# Patient Record
Sex: Male | Born: 1985 | Race: White | Hispanic: No | Marital: Single | Smoking: Current every day smoker
Health system: Southern US, Community
[De-identification: ages and names within clinical notes are randomized; demographics above are authoritative.]

## PROBLEM LIST (undated history)

## (undated) ENCOUNTER — Emergency Department (HOSPITAL_COMMUNITY): Payer: Self-pay

---
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
## 2017-07-22 ENCOUNTER — Emergency Department (HOSPITAL_COMMUNITY): Payer: Non-veteran care

## 2017-07-22 ENCOUNTER — Inpatient Hospital Stay (HOSPITAL_COMMUNITY): Payer: Non-veteran care

## 2017-07-22 ENCOUNTER — Encounter (HOSPITAL_COMMUNITY): Payer: Self-pay | Admitting: Radiology

## 2017-07-22 ENCOUNTER — Inpatient Hospital Stay (HOSPITAL_COMMUNITY)
Admission: EM | Admit: 2017-07-22 | Discharge: 2017-07-27 | DRG: 083 | Disposition: A | Discharge status: Home / Self Care | Payer: Non-veteran care | Attending: Surgery | Admitting: Surgery

## 2017-07-22 DIAGNOSIS — S0219XA Other fracture of base of skull, initial encounter for closed fracture: Secondary | ICD-10-CM | POA: Diagnosis present

## 2017-07-22 DIAGNOSIS — R40241 Glasgow coma scale score 13-15, unspecified time: Secondary | ICD-10-CM | POA: Diagnosis present

## 2017-07-22 DIAGNOSIS — G9389 Other specified disorders of brain: Secondary | ICD-10-CM | POA: Diagnosis present

## 2017-07-22 DIAGNOSIS — M25529 Pain in unspecified elbow: Secondary | ICD-10-CM

## 2017-07-22 DIAGNOSIS — S020XXB Fracture of vault of skull, initial encounter for open fracture: Secondary | ICD-10-CM | POA: Diagnosis present

## 2017-07-22 DIAGNOSIS — S064XAA Epidural hemorrhage with loss of consciousness status unknown, initial encounter: Secondary | ICD-10-CM | POA: Diagnosis present

## 2017-07-22 DIAGNOSIS — N179 Acute kidney failure, unspecified: Secondary | ICD-10-CM | POA: Diagnosis present

## 2017-07-22 DIAGNOSIS — H1130 Conjunctival hemorrhage, unspecified eye: Secondary | ICD-10-CM | POA: Diagnosis present

## 2017-07-22 DIAGNOSIS — T1490XA Injury, unspecified, initial encounter: Secondary | ICD-10-CM

## 2017-07-22 DIAGNOSIS — S064X9A Epidural hemorrhage with loss of consciousness of unspecified duration, initial encounter: Secondary | ICD-10-CM | POA: Diagnosis present

## 2017-07-22 DIAGNOSIS — S0101XA Laceration without foreign body of scalp, initial encounter: Secondary | ICD-10-CM | POA: Diagnosis present

## 2017-07-22 DIAGNOSIS — F4310 Post-traumatic stress disorder, unspecified: Secondary | ICD-10-CM | POA: Diagnosis present

## 2017-07-22 DIAGNOSIS — F17210 Nicotine dependence, cigarettes, uncomplicated: Secondary | ICD-10-CM | POA: Diagnosis present

## 2017-07-22 DIAGNOSIS — S065X9A Traumatic subdural hemorrhage with loss of consciousness of unspecified duration, initial encounter: Secondary | ICD-10-CM | POA: Diagnosis present

## 2017-07-22 DIAGNOSIS — S0181XA Laceration without foreign body of other part of head, initial encounter: Secondary | ICD-10-CM | POA: Diagnosis present

## 2017-07-22 DIAGNOSIS — R51 Headache: Secondary | ICD-10-CM | POA: Diagnosis present

## 2017-07-22 DIAGNOSIS — S066X9A Traumatic subarachnoid hemorrhage with loss of consciousness of unspecified duration, initial encounter: Secondary | ICD-10-CM | POA: Diagnosis present

## 2017-07-22 LAB — BPAM FFP
BLOOD PRODUCT EXPIRATION DATE: 201808052359
Blood Product Expiration Date: 201808042359
ISSUE DATE / TIME: 201807311242
ISSUE DATE / TIME: 201807311242
Unit Type and Rh: 600
Unit Type and Rh: 6200

## 2017-07-22 LAB — TYPE AND SCREEN
ABO/RH(D): O POS
ANTIBODY SCREEN: NEGATIVE
Unit division: 0
Unit division: 0

## 2017-07-22 LAB — PREPARE FRESH FROZEN PLASMA
UNIT DIVISION: 0
Unit division: 0

## 2017-07-22 LAB — COMPREHENSIVE METABOLIC PANEL
ALBUMIN: 4.1 g/dL (ref 3.5–5.0)
ALK PHOS: 61 U/L (ref 38–126)
ALT: 15 U/L — AB (ref 17–63)
ANION GAP: 9 (ref 5–15)
AST: 21 U/L (ref 15–41)
BUN: 14 mg/dL (ref 6–20)
CALCIUM: 9 mg/dL (ref 8.9–10.3)
CO2: 25 mmol/L (ref 22–32)
CREATININE: 1.46 mg/dL — AB (ref 0.61–1.24)
Chloride: 103 mmol/L (ref 101–111)
GFR calc Af Amer: >60 mL/min (ref >60)
GFR calc non Af Amer: >60 mL/min (ref >60)
GLUCOSE: 123 mg/dL — AB (ref 65–99)
Potassium: 3.8 mmol/L (ref 3.5–5.1)
SODIUM: 137 mmol/L (ref 135–145)
Total Bilirubin: 0.7 mg/dL (ref 0.3–1.2)
Total Protein: 7.4 g/dL (ref 6.5–8.1)

## 2017-07-22 LAB — BPAM RBC
BLOOD PRODUCT EXPIRATION DATE: 201808032359
Blood Product Expiration Date: 201808042359
ISSUE DATE / TIME: 201807311241
ISSUE DATE / TIME: 201807311241
UNIT TYPE AND RH: 9500
Unit Type and Rh: 9500

## 2017-07-22 LAB — CBC
HCT: 47 % (ref 39.0–52.0)
HEMOGLOBIN: 15.9 g/dL (ref 13.0–17.0)
MCH: 31.8 pg (ref 26.0–34.0)
MCHC: 33.8 g/dL (ref 30.0–36.0)
MCV: 94 fL (ref 78.0–100.0)
Platelets: 251 10*3/uL (ref 150–400)
RBC: 5 MIL/uL (ref 4.22–5.81)
RDW: 13.6 % (ref 11.5–15.5)
WBC: 15 10*3/uL — ABNORMAL HIGH (ref 4.0–10.5)

## 2017-07-22 LAB — RAPID URINE DRUG SCREEN, HOSP PERFORMED
Amphetamines: POSITIVE — AB
BARBITURATES: NOT DETECTED
BENZODIAZEPINES: POSITIVE — AB
COCAINE: NOT DETECTED
Opiates: NOT DETECTED
TETRAHYDROCANNABINOL: NOT DETECTED

## 2017-07-22 LAB — PROTIME-INR
INR: 0.98
Prothrombin Time: 13 seconds (ref 11.4–15.2)

## 2017-07-22 LAB — ETHANOL

## 2017-07-22 LAB — ABO/RH: ABO/RH(D): O POS

## 2017-07-22 MED ORDER — IOPAMIDOL (ISOVUE-370) INJECTION 76%
INTRAVENOUS | Status: AC
  Administered 2017-07-22: 50 mL
  Filled 2017-07-22: qty 50

## 2017-07-22 MED ORDER — LIDOCAINE-EPINEPHRINE (PF) 2 %-1:200000 IJ SOLN
20.0000 mL | Freq: Once | INTRAMUSCULAR | Status: AC
  Administered 2017-07-22: 20 mL via INTRADERMAL

## 2017-07-22 MED ORDER — SODIUM CHLORIDE 0.9 % IV SOLN
500.0000 mg | Freq: Two times a day (BID) | INTRAVENOUS | Status: DC
  Administered 2017-07-22 – 2017-07-27 (×10): 500 mg via INTRAVENOUS
  Filled 2017-07-22 (×12): qty 5

## 2017-07-22 MED ORDER — DEXTROSE 5 % IV SOLN
1.0000 g | Freq: Three times a day (TID) | INTRAVENOUS | Status: AC
  Administered 2017-07-22 – 2017-07-27 (×14): 1 g via INTRAVENOUS
  Filled 2017-07-22 (×14): qty 1

## 2017-07-22 MED ORDER — VANCOMYCIN HCL IN DEXTROSE 750-5 MG/150ML-% IV SOLN
750.0000 mg | Freq: Two times a day (BID) | INTRAVENOUS | Status: DC
  Administered 2017-07-23: 750 mg via INTRAVENOUS
  Filled 2017-07-22 (×2): qty 150

## 2017-07-22 MED ORDER — CEFAZOLIN SODIUM-DEXTROSE 2-4 GM/100ML-% IV SOLN
2.0000 g | Freq: Once | INTRAVENOUS | Status: AC
  Administered 2017-07-22: 2 g via INTRAVENOUS
  Filled 2017-07-22: qty 100

## 2017-07-22 MED ORDER — POTASSIUM CHLORIDE IN NACL 20-0.9 MEQ/L-% IV SOLN
INTRAVENOUS | Status: DC
  Administered 2017-07-22 – 2017-07-23 (×2): via INTRAVENOUS
  Filled 2017-07-22 (×4): qty 1000

## 2017-07-22 MED ORDER — ONDANSETRON 4 MG PO TBDP: 4.0000 mg | ORAL_TABLET | Freq: Four times a day (QID) | ORAL | Status: DC | PRN

## 2017-07-22 MED ORDER — FENTANYL CITRATE (PF) 100 MCG/2ML IJ SOLN
INTRAMUSCULAR | Status: AC
  Filled 2017-07-22: qty 2

## 2017-07-22 MED ORDER — PANTOPRAZOLE SODIUM 40 MG PO TBEC: 40.0000 mg | DELAYED_RELEASE_TABLET | Freq: Every day | ORAL | Status: DC

## 2017-07-22 MED ORDER — BACITRACIN ZINC 500 UNIT/GM EX OINT
TOPICAL_OINTMENT | Freq: Two times a day (BID) | CUTANEOUS | Status: DC
  Administered 2017-07-22 – 2017-07-23 (×2): via TOPICAL
  Administered 2017-07-23: 1 via TOPICAL
  Administered 2017-07-23 – 2017-07-24 (×2): via TOPICAL
  Administered 2017-07-24: 1 via TOPICAL
  Administered 2017-07-25 – 2017-07-27 (×5): via TOPICAL
  Filled 2017-07-22 (×17): qty 28.35

## 2017-07-22 MED ORDER — TETANUS-DIPHTH-ACELL PERTUSSIS 5-2.5-18.5 LF-MCG/0.5 IM SUSP
0.5000 mL | Freq: Once | INTRAMUSCULAR | Status: AC
  Administered 2017-07-22: 0.5 mL via INTRAMUSCULAR
  Filled 2017-07-22: qty 0.5

## 2017-07-22 MED ORDER — LIDOCAINE-EPINEPHRINE (PF) 2 %-1:200000 IJ SOLN
INTRAMUSCULAR | Status: AC
  Filled 2017-07-22: qty 20

## 2017-07-22 MED ORDER — ONDANSETRON HCL 4 MG/2ML IJ SOLN: 4.0000 mg | Freq: Four times a day (QID) | INTRAMUSCULAR | Status: DC | PRN

## 2017-07-22 MED ORDER — VANCOMYCIN HCL IN DEXTROSE 1-5 GM/200ML-% IV SOLN
1000.0000 mg | Freq: Once | INTRAVENOUS | Status: AC
  Administered 2017-07-22: 1000 mg via INTRAVENOUS
  Filled 2017-07-22: qty 200

## 2017-07-22 MED ORDER — FENTANYL CITRATE (PF) 100 MCG/2ML IJ SOLN
25.0000 ug | INTRAMUSCULAR | Status: DC | PRN
  Administered 2017-07-23: 50 ug via INTRAVENOUS
  Filled 2017-07-22: qty 2

## 2017-07-22 MED ORDER — PANTOPRAZOLE SODIUM 40 MG IV SOLR
40.0000 mg | Freq: Every day | INTRAVENOUS | Status: DC
  Administered 2017-07-22: 40 mg via INTRAVENOUS
  Filled 2017-07-22: qty 40

## 2017-07-22 MED ORDER — FENTANYL CITRATE (PF) 100 MCG/2ML IJ SOLN
100.0000 ug | Freq: Once | INTRAMUSCULAR | Status: AC
  Administered 2017-07-22: 100 ug via INTRAVENOUS

## 2017-07-22 MED ORDER — FENTANYL CITRATE (PF) 100 MCG/2ML IJ SOLN
INTRAMUSCULAR | Status: AC
  Administered 2017-07-22: 100 ug
  Filled 2017-07-22: qty 2

## 2017-07-22 NOTE — ED Notes (Signed)
Patient is starting to talk more c/o pain in left arm and head however can't give a pain number.
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
## 2017-07-22 NOTE — Progress Notes (Addendum)
Reviewed repeat head CT and discussed with Dr Bevely Palmeritty. Fairly stable. No need for surgical intervention. Continue frequent neuro checks. Repeat head CT at 0600 tomorrow, sooner as indicated.   Addendum Saw patient while at hospital Remains neurologically stable Knows at hospital, but unsure of year Follows all commands Moves all extremities well with good strength with exception of LUE weakness secondary to pain

## 2017-07-22 NOTE — ED Notes (Signed)
[place] [employer] [place] spoke with Dad Dorene Sorrow( Jerry ) [phone]  I also spoke with dad and he is on his way
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
## 2017-07-22 NOTE — ED Provider Notes (Signed)
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 results that were available during my care of the patient were reviewed by me and considered in my medical decision making (see chart for details).     CT scan shows epidural hematoma and pneumocephalus. Neurosurgery was consulted. Patient has remained awake and alert. He was given tetanus and 2 g IV Ancef. Fentanyl IV for pain. Neurosurgery evaluated in the ED and at this time given small hematoma, they will watch him in the ICU. He will get a repeat CT scan. Currently he is having no vomiting and does not appear to need immediate airway intervention. Otherwise neurologically intact. Neurosurgery asks for deep laceration to be closed as he does not appear to have a depressed skull fracture and can be safely irrigated per them. This was performed by myself and the trauma PA who closed the occipital scalp laceration. Patient will be admitted to the ICU for close monitoring. His dad was updated when he arrived. He has a mild area of neck swelling but no significant neck trauma noted. Further trauma scans are unremarkable.  Final Clinical Impressions(s) / ED Diagnoses   Final diagnoses:  Epidural hematoma (HCC)  Laceration of forehead,  initial encounter  Laceration of scalp, initial encounter    New Prescriptions New Prescriptions   No medications on file     Pricilla Loveless, MD 07/22/17 1557

## 2017-07-22 NOTE — ED Notes (Signed)
Called report to [facility] N room 32

## 2017-07-22 NOTE — ED Notes (Signed)
Trauma PAs at bedside performing suture repair at this time

## 2017-07-22 NOTE — Progress Notes (Signed)
07/22/2017 6:02 PM  Isaias SakaiXXXFerguson, Myron [id number]      Temp:  [98 F (36.7 C)-98.5 F (36.9 C)] 98.5 F (36.9 C) (07/31 1620) Pulse Rate:  [92-102] 99 (07/31 1700) Resp:  [16-18] 18 (07/31 1700) BP: (114-140)/(82-98) 114/91 (07/31 1700) SpO2:  [96 %-100 %] 99 % (07/31 1700) Weight:  [77 kg (169 lb 12.1 oz)-77.1 kg (170 lb)] 77 kg (169 lb 12.1 oz) (07/31 1620),     Intake/Output Summary (Last 24 hours) at 07/22/17 1802 Last data filed at 07/22/17 1700  Gross per 24 hour  Intake              105 ml  Output              400 ml  Net             -295 ml    Results for orders placed or performed during the hospital encounter of 07/22/17 (from the past 24 hour(s))  Prepare fresh frozen plasma     Status: None   Collection Time: 07/22/17 12:37 PM  Result Value Ref Range   Unit Number [id number]    Blood Component Type THAWED PLASMA    Unit division 00    [facility]    Unit tag comment VERBAL ORDERS PER DR GOLDSTON    Transfusion Status OK TO TRANSFUSE    Unit Number [id number]    Blood Component Type LIQ PLASMA    Unit division 00    [facility]    Unit tag comment VERBAL ORDERS PER DR GOLDSTON    Transfusion Status OK TO TRANSFUSE   Type and screen     Status: None   Collection Time: 07/22/17  1:10 PM  Result Value Ref Range   ABO/RH(D) O POS    Antibody Screen NEG    Sample Expiration 07/25/2017    Unit Number [id number]    Blood Component Type RBC LR PHER1    Unit division 00    [facility]    Unit tag comment VERBAL ORDERS PER DR GOLDSTON    Transfusion Status OK TO TRANSFUSE    Crossmatch Result NOT NEEDED    Unit Number [id number]    Blood Component Type RED CELLS,LR    Unit division 00    [facility]    Unit tag comment VERBAL ORDERS PER DR GOLDSTON    Transfusion Status OK TO TRANSFUSE    Crossmatch Result NOT NEEDED   Comprehensive metabolic panel     Status: Abnormal    Collection Time: 07/22/17  1:10 PM  Result Value Ref Range   Sodium 137 135 - 145 mmol/L   Potassium 3.8 3.5 - 5.1 mmol/L   Chloride 103 101 - 111 mmol/L   CO2 25 22 - 32 mmol/L   Glucose, Bld 123 (H) 65 - 99 mg/dL   BUN 14 6 - 20 mg/dL   Creatinine, Ser 3.471.46 (H) 0.61 - 1.24 mg/dL   Calcium 9.0 8.9 - 42.510.3 mg/dL   Total Protein 7.4 6.5 - 8.1 g/dL   Albumin 4.1 3.5 - 5.0 g/dL   AST 21 15 - 41 U/L   ALT 15 (L) 17 - 63 U/L   Alkaline Phosphatase 61 38 - 126 U/L   Total Bilirubin 0.7 0.3 - 1.2 mg/dL   GFR calc non Af Amer >60 >60 mL/min   GFR calc Af Amer >60 >60 mL/min  Anion gap 9 5 - 15  CBC     Status: Abnormal   Collection Time: 07/22/17  1:10 PM  Result Value Ref Range   WBC 15.0 (H) 4.0 - 10.5 K/uL   RBC 5.00 4.22 - 5.81 MIL/uL   Hemoglobin 15.9 13.0 - 17.0 g/dL   HCT 16.147.0 09.639.0 - 04.552.0 %   MCV 94.0 78.0 - 100.0 fL   MCH 31.8 26.0 - 34.0 pg   MCHC 33.8 30.0 - 36.0 g/dL   RDW 40.913.6 81.111.5 - 91.415.5 %   Platelets 251 150 - 400 K/uL  Ethanol     Status: None   Collection Time: 07/22/17  1:10 PM  Result Value Ref Range   Alcohol, Ethyl (B) <5 <5 mg/dL  Protime-INR     Status: None   Collection Time: 07/22/17  1:10 PM  Result Value Ref Range   Prothrombin Time 13.0 11.4 - 15.2 seconds   INR 0.98   ABO/Rh     Status: None   Collection Time: 07/22/17  1:10 PM  Result Value Ref Range   ABO/RH(D) O POS      IMPRESSION:  Open but minimally displaced LEFT frontal bone fx.  Pneumocephalus may be from open soft tissue of forehead.  No clear evidence of sinus fx including LEFT frontal sinus.  Minimal thickening in several paranasal sinuses of low concern.  Orbital roof fx with no displacement.    PLAN:  Pt in CT upon my arrival.  I will return in AM.  Will not need surgical intervention for fx's.  Doubt that he needs Ophth but will withhold judgement until I have evaluated the pt.     Flo ShanksWOLICKI, Delmore Sear
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
## 2017-07-22 NOTE — Procedures (Signed)
Procedure - scalp laceration repair  Attending - Dr. Harriette Bouillonhomas Cornett  Patient was positioned appropriately, 4 cc lidocaine with epinephrine was used as a local anesthetic. 50cc NaCl was used for irrigation - no foreign bodies appreciated. Patient was draped in typical sterile fashion with 2 cm left parietal scalp wound exposed. 4 staples were placed with good approximation. Procedure tolerated without complications. Orders placed to apply bacitracin to scalp wounds BID.   Joseph SpangleElizabeth Simaan, PA-C [facility] Pager: [phone] Consults: [phone] Mon-Fri 7:00 am-4:30 pm Sat-Sun 7:00 am-11:30 am

## 2017-07-22 NOTE — Progress Notes (Signed)
Orthopedic Tech Progress Note Patient Details:  Cipriano MileBrett Geraldo 07/21/1986 [id number]  Patient ID: Cipriano MileBrett Hassinger, male   DOB: 04/14/1986, 31 y.o.   MRN: [id number]   Nikki DomCrawford, Affan Callow 07/22/2017, 1:11 PM Made level 1 trauma visit
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
## 2017-07-23 ENCOUNTER — Inpatient Hospital Stay (HOSPITAL_COMMUNITY): Payer: Non-veteran care

## 2017-07-23 LAB — CBC
HEMATOCRIT: 44.1 % (ref 39.0–52.0)
Hemoglobin: 14.7 g/dL (ref 13.0–17.0)
MCH: 31.4 pg (ref 26.0–34.0)
MCHC: 33.3 g/dL (ref 30.0–36.0)
MCV: 94.2 fL (ref 78.0–100.0)
PLATELETS: 228 10*3/uL (ref 150–400)
RBC: 4.68 MIL/uL (ref 4.22–5.81)
RDW: 13.5 % (ref 11.5–15.5)
WBC: 10.8 10*3/uL — AB (ref 4.0–10.5)

## 2017-07-23 LAB — COMPREHENSIVE METABOLIC PANEL
ALT: 14 U/L — AB (ref 17–63)
AST: 21 U/L (ref 15–41)
Albumin: 3.7 g/dL (ref 3.5–5.0)
Alkaline Phosphatase: 58 U/L (ref 38–126)
Anion gap: 7 (ref 5–15)
BILIRUBIN TOTAL: 1.1 mg/dL (ref 0.3–1.2)
BUN: 10 mg/dL (ref 6–20)
CHLORIDE: 102 mmol/L (ref 101–111)
CO2: 29 mmol/L (ref 22–32)
CREATININE: 1.2 mg/dL (ref 0.61–1.24)
Calcium: 8.5 mg/dL — ABNORMAL LOW (ref 8.9–10.3)
GFR calc Af Amer: >60 mL/min (ref >60)
GLUCOSE: 91 mg/dL (ref 65–99)
Potassium: 4 mmol/L (ref 3.5–5.1)
Sodium: 138 mmol/L (ref 135–145)
Total Protein: 6.4 g/dL — ABNORMAL LOW (ref 6.5–8.1)

## 2017-07-23 LAB — HIV ANTIBODY (ROUTINE TESTING W REFLEX): HIV Screen 4th Generation wRfx: NONREACTIVE

## 2017-07-23 LAB — BLOOD PRODUCT ORDER (VERBAL) VERIFICATION

## 2017-07-23 LAB — CDS SEROLOGY

## 2017-07-23 MED ORDER — ACETAMINOPHEN 325 MG PO TABS: 650.0000 mg | ORAL_TABLET | Freq: Four times a day (QID) | ORAL | Status: DC | PRN

## 2017-07-23 MED ORDER — DOCUSATE SODIUM 100 MG PO CAPS
100.0000 mg | ORAL_CAPSULE | Freq: Two times a day (BID) | ORAL | Status: DC
  Administered 2017-07-23 – 2017-07-27 (×8): 100 mg via ORAL
  Filled 2017-07-23 (×8): qty 1

## 2017-07-23 MED ORDER — LORAZEPAM 2 MG/ML IJ SOLN
1.0000 mg | Freq: Three times a day (TID) | INTRAMUSCULAR | Status: DC | PRN
  Administered 2017-07-23 – 2017-07-26 (×2): 1 mg via INTRAVENOUS
  Filled 2017-07-23 (×2): qty 1

## 2017-07-23 MED ORDER — NICOTINE 21 MG/24HR TD PT24
21.0000 mg | MEDICATED_PATCH | Freq: Every day | TRANSDERMAL | Status: DC
  Administered 2017-07-23: 21 mg via TRANSDERMAL
  Filled 2017-07-23: qty 1

## 2017-07-23 MED ORDER — HYDROMORPHONE HCL 1 MG/ML IJ SOLN
0.5000 mg | INTRAMUSCULAR | Status: DC | PRN
  Administered 2017-07-23: 0.5 mg via INTRAVENOUS
  Filled 2017-07-23: qty 0.5

## 2017-07-23 MED ORDER — VANCOMYCIN HCL IN DEXTROSE 1-5 GM/200ML-% IV SOLN
1000.0000 mg | Freq: Two times a day (BID) | INTRAVENOUS | Status: AC
  Administered 2017-07-23 – 2017-07-27 (×9): 1000 mg via INTRAVENOUS
  Filled 2017-07-23 (×9): qty 200

## 2017-07-23 MED ORDER — OXYCODONE HCL 5 MG PO TABS
5.0000 mg | ORAL_TABLET | ORAL | Status: DC | PRN
  Administered 2017-07-23 – 2017-07-27 (×14): 5 mg via ORAL
  Filled 2017-07-23 (×15): qty 1
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
## 2017-07-23 NOTE — Progress Notes (Signed)
Pharmacy Antibiotic Note  Joseph [name] is a 31 y.o. male admitted on 07/22/2017 with open skull fracture.  Pharmacy has been consulted for vancomycin and cefepime dosing for prophylaxis. Noted plan for 5 days of therapy. SCr has decreased to 1.2. Pt is afebrile and WBC is WNL.   Vancomycin trough goal 10-15  Plan: Change vancomycin to 1gm IV Q12H Continue cefepime as ordered F/u renal fxn, C&S, clinical status and LOT  Height: 5\' 10"  (177.8 cm) Weight: 169 lb 12.1 oz (77 kg) IBW/kg (Calculated) : 73  Temp (24hrs), Avg:98 F (36.7 C), Min:97.6 F (36.4 C), Max:98.5 F (36.9 C)   Recent Labs Lab 07/22/17 1310 07/23/17 0319  WBC 15.0* 10.8*  CREATININE 1.46* 1.20    Estimated Creatinine Clearance: 92.1 mL/min (by C-G formula based on SCr of 1.2 mg/dL).    No Known Allergies  Antimicrobials this admission: 7/31 Cefazolin x 1 7/31 Vancomycin >> 7/31 Cefepime >>  Dose adjustments this admission: 8/1 Change vanc to 1gm Q12H d/t decrease Scr  Microbiology results: none  Thank you for allowing pharmacy to be a part of this patient's care.  Joseph [name], Joseph [name] 07/23/2017 8:16 AM

## 2017-07-23 NOTE — Progress Notes (Signed)
Pt seen and examined.  No issues overnight.   EXAM: Temp:  [97.6 F (36.4 C)-98.5 F (36.9 C)] 97.9 F (36.6 C) (08/01 0400) Pulse Rate:  [71-102] 90 (08/01 0734) Resp:  [13-18] 15 (08/01 0734) BP: (111-140)/(73-98) 116/79 (08/01 0734) SpO2:  [96 %-100 %] 100 % (08/01 0734) Weight:  [77 kg (169 lb 12.1 oz)-77.1 kg (170 lb)] 77 kg (169 lb 12.1 oz) (07/31 1620) Intake/Output      07/31 0701 - 08/01 0700 08/01 0701 - 08/02 0700   P.O. 0    I.V. (mL/kg) 1400 (18.2)    Other 0    IV Piggyback 660    Total Intake(mL/kg) 2060 (26.8)    Urine (mL/kg/hr) 825    Total Output 825     Net +1235           Lethargic but does awaken to voice  Knows hes in the hospital, but continues to say its 2017. Follows commands Able to open right eye. Too much swelling/bruising to open left eye MAEW with good strength. Continues to have LUE weakness due to elbow pain (xrays negative)  Plan Repeat Head CT stable  Epidural/SDH/SAH hematoma - continue frequent neuro checks - Continue Keppra for seizure prophylaxis  Open left frontal skull fracture without depression - Continue Vanc and Cefepime per pharm consult for abx prophylaxis x5 days
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
## 2017-07-24 ENCOUNTER — Encounter (HOSPITAL_COMMUNITY): Payer: Self-pay

## 2017-07-24 MED ORDER — NICOTINE 21 MG/24HR TD PT24
21.0000 mg | MEDICATED_PATCH | Freq: Every day | TRANSDERMAL | Status: DC
  Administered 2017-07-24 – 2017-07-26 (×3): 21 mg via TRANSDERMAL
  Filled 2017-07-24 (×3): qty 1
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
## 2017-07-24 NOTE — Progress Notes (Signed)
Pt transferred to room 6 No 07. Heathe rRN took report. Pts dad notified of new room #.
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
## 2017-07-25 MED ORDER — ACETAMINOPHEN 500 MG PO TABS
1000.0000 mg | ORAL_TABLET | Freq: Three times a day (TID) | ORAL | Status: DC
  Administered 2017-07-25 – 2017-07-27 (×8): 1000 mg via ORAL
  Filled 2017-07-25 (×8): qty 2

## 2017-07-25 NOTE — Discharge Instructions (Signed)
Traumatic Brain Injury °Traumatic brain injury (TBI) is an injury to your brain from a blow to your head (closed injury) or an object penetrating your skull and entering your brain (open injury). The severity of TBI varies significantly from one person to the next. Some TBIs cause you to pass out (lose consciousness) immediately and for a long period of time. Other TBIs do not cause any loss of consciousness. °Symptoms of any type of TBI can be long lasting (chronic). TBI can interfere with memory and speech. TBI can also cause chronic symptoms like headache or dizziness. °What are the causes? °TBI is caused by a closed or open injury. °What increases the risk? °You may be at higher risk for TBI if you: °· Are 75 or older. °· Are a man. °· Are in a car accident. °· Play a contact sport, especially football, hockey, or soccer. °· Do not wear protective gear while playing sports. °· Are in the military. °· Are a victim of violence. °· Abuse drugs or alcohol. °· Have had a previous TBI. ° °What are the signs or symptoms? °Signs and symptoms of TBI may occur right away or not until days, weeks, or months after the injury. They may last for days, weeks, months, or years. Symptoms may include: °· Loss of consciousness. °· Headache. °· Confusion. °· Fatigue. °· Changes in sleep. °· Dizziness. °· Mood or personality changes. °· Memory problems. °· Nausea or vomiting or both. °· Seizures. °· Clumsiness. °· Slurred speech. °· Depression and anxiety. °· Anger. °· Inability to control emotions or actions (impulse control). °· Loss of or dulling of your senses, such as hearing, vision, and touch. This can include: °? Blurred vision. °? Ringing in your ears. ° °How is this diagnosed? °TBI may be diagnosed by a medical history and physical exam. Your health care provider will also do a neurologic exam to check your: °· Reflexes. °· Sensations. °· Alertness. °· Memory. °· Vision. °· Hearing. °· Coordination. ° °Your health care  provider will also do tests to diagnose the extent of your TBI, such as a CT scan of your brain and skull. One way to determine the severity of your TBI is with a scoring system called the Glasgow Coma Scale (GCS). It measures eye opening, motor response, and verbal response. The higher the score, the milder the TBI. °Your TBI may be described as mild, moderate, or severe: °· Mild TBI (concussion). °? Symptoms of mild TBI usually go away on their own. This can take weeks or months, depending on the type of concussion. °? Your GCS will be 13-15. °? Your brain CT scan will be normal. °? You may or may not have a short hospital stay. °· Moderate TBI. °? Your GCS will be 9-12. °? Your brain CT scan will be abnormal. °? You will likely need a short hospital stay. °· Severe TBI. °? Your GCS will be 3-8. °? Your brain CT scan will be abnormal. °? You may need a long stay in the hospital. ° °How is this treated? °Emergency treatment of TBI may involve measures to maintain a clear airway and stable blood pressure. Brain surgery may be needed to: °· Remove a blood clot. °· Repair bleeding. °· Remove an object that has penetrated the brain, such as a skull fragment or a bullet. ° °Other treatments depend on your chronic signs and symptoms. These treatments include the following types of therapy: °· Physical. °· Occupational. °· Speech and language. °· Mental health. °·   Social support. ° °Follow these instructions at home: °· Carefully follow all your health care provider's instructions. °· Work closely with all your therapists, if necessary. °· Take medicines only as directed by your health care provider. Do not take aspirin or other anti-inflammatory medicines such as ibuprofen or naproxen unless approved by your health care provider. °· Do not abuse illegal drugs. °· Limit alcohol intake to no more than 1 drink per day for nonpregnant women and 2 drinks per day for men. One drink equals 12 ounces of beer, 5 ounces of wine,  or 1½ ounces of hard liquor. °· Avoid any situation where there is potential for another head injury, such as football, hockey, soccer, basketball, martial arts, downhill snow sports, and horseback riding. Do not do these activities until your health care provider approves. °· Rest. Rest helps the brain to heal. Make sure you: °? Get plenty of sleep at night. Avoid staying up late at night. °? Keep the same bedtime hours on weekends and weekdays. °? Rest during the day. Take daytime naps or rest breaks when you feel tired. °· Avoid excessive visual stimulation while recovering from a TBI. This includes work on the computer, watching TV, and reading. °· Try to avoid activities that cause physical or mental stress. Stay home from work or school as directed by your health care provider. °· Make lists to plan your day and help your memory. °· Do not drive, ride a bicycle, or operate heavy machinery until your health care provider approves. °· Seek support from friends and family. °· Keep all follow-up visits as directed by your health care provider. This is important. °· Watch your symptoms and tell others to do the same. Complications sometimes occur after a TBI. °How is this prevented? °· Wear a helmet while biking, skiing, skateboarding, skating, or doing similar activities. Wear your seatbelt while driving. °· Do not abuse alcohol or drugs. °· Do not drink and drive. °· Prevent falls at home by: °? Removing clutter and tripping hazards, including loose rugs, from floors and stairways. °? Using grab bars in bathrooms and handrails by stairs. °? Placing nonslip mats on floors and in bathtubs. °? Improving lighting in dim areas. °Contact a health care provider if: °Seek medical care if you have any of the following symptoms for more than two weeks after your injury: °· Chronic headaches. °· Dizziness or balance problems. °· Nausea. °· Vision problems. °· Increased sensitivity to noise or light. °· Depression or mood  swings. °· Anxiety or irritability. °· Memory problems. °· Difficulty concentrating or paying attention. °· Sleep problems. °· Feeling tired all the time. ° °Get help right away if: °· You have confusion or unusual drowsiness. °· It is difficult to wake you up. °· You have nausea or persistent, forceful vomiting. °· You feel like you are moving when you are not (vertigo). Your eyes may move rapidly back and forth. °· You have convulsions or faint. °· You have severe, persistent headaches that are not relieved by medicine. °· You cannot use your arms or legs normally. °· One of your pupils is larger than the other. °· You have clear or bloody discharge from your nose or ears. °· Your problems are getting worse, not better. °This information is not intended to replace advice given to you by your health care provider. Make sure you discuss any questions you have with your health care provider. °Document Released: 11/29/2002 Document Revised: 08/11/2016 Document Reviewed: 03/24/2014 °Elsevier Interactive Patient Education ©   2017 [employer] ° °
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
## 2017-07-26 LAB — BASIC METABOLIC PANEL
Anion gap: 9 (ref 5–15)
BUN: 11 mg/dL (ref 6–20)
CALCIUM: 9 mg/dL (ref 8.9–10.3)
CO2: 25 mmol/L (ref 22–32)
CREATININE: 1.08 mg/dL (ref 0.61–1.24)
Chloride: 102 mmol/L (ref 101–111)
GFR calc Af Amer: >60 mL/min (ref >60)
GLUCOSE: 103 mg/dL — AB (ref 65–99)
Potassium: 4.2 mmol/L (ref 3.5–5.1)
SODIUM: 136 mmol/L (ref 135–145)

## 2017-07-26 LAB — CBC
HCT: 40.4 % (ref 39.0–52.0)
Hemoglobin: 14 g/dL (ref 13.0–17.0)
MCH: 32 pg (ref 26.0–34.0)
MCHC: 34.7 g/dL (ref 30.0–36.0)
MCV: 92.2 fL (ref 78.0–100.0)
PLATELETS: 266 10*3/uL (ref 150–400)
RBC: 4.38 MIL/uL (ref 4.22–5.81)
RDW: 12.8 % (ref 11.5–15.5)
WBC: 9.7 10*3/uL (ref 4.0–10.5)

## 2017-07-26 MED ORDER — OXYCODONE HCL 5 MG PO TABS: 5.0000 mg | ORAL_TABLET | ORAL | 0 refills | Status: DC | PRN

## 2017-07-26 MED ORDER — BACITRACIN ZINC 500 UNIT/GM EX OINT: TOPICAL_OINTMENT | Freq: Two times a day (BID) | CUTANEOUS | 0 refills | Status: AC

## 2017-07-26 MED ORDER — SODIUM CHLORIDE 0.9 % IV BOLUS (SEPSIS)
500.0000 mL | Freq: Once | INTRAVENOUS | Status: AC
  Administered 2017-07-26: 500 mL via INTRAVENOUS

## 2017-07-26 MED ORDER — LEVETIRACETAM 500 MG PO TABS: 500.0000 mg | ORAL_TABLET | Freq: Two times a day (BID) | ORAL | 0 refills | Status: DC

## 2017-07-26 MED ORDER — ACETAMINOPHEN 500 MG PO TABS: 1000.0000 mg | ORAL_TABLET | Freq: Three times a day (TID) | ORAL | 0 refills | Status: AC

## 2017-07-26 NOTE — Progress Notes (Signed)
Patient requested IV in RAC and RH to be removed due to pain. Removed the IVs per patient's request. Informed patient he would need a new IV to receive his scheduled antibiotics. Patient stated he did not want to receive a new IV at this time. He stated to wait until after breakfast. I informed the patient it was very important for him to get a new IV and to get his antibiotics. He acknowledged and stated he still wanted to wait until after breakfast.
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
## 2017-07-26 NOTE — Progress Notes (Signed)
Patient ID: Joseph SakaiBrett [name], male   DOB: 01/24/1986, 31 y.o.   MRN: [id number] Tachycardic when standing and moving with dizziness, check labs now if he agrees. He has not been agreeable to any real evaluation or treatment today
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
## 2017-07-26 NOTE — Progress Notes (Signed)
Patient still refusing IV reinsertion and also refused lab draw this am.

## 2017-07-27 MED ORDER — LEVETIRACETAM 500 MG PO TABS: 500.0000 mg | ORAL_TABLET | Freq: Two times a day (BID) | ORAL | 0 refills | Status: AC

## 2017-07-27 MED ORDER — OXYCODONE HCL 5 MG PO TABS: 5.0000 mg | ORAL_TABLET | ORAL | 0 refills | Status: AC | PRN

## 2017-07-27 NOTE — Progress Notes (Signed)
Discharge instructions gone over with patient and family members at bedside. Home medications gone over. Prescription given. Follow up appointments to be made. Traumatic brain injury information gone over and given to patient. Signs and symptoms of worsening condition and reasons to call the doctor discussed. Patient verbalized understanding of information.
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
## 2023-05-24 DEATH — deceased
